# Patient Record
Sex: Male | Born: 1960 | Race: White | Hispanic: No | Marital: Married | Smoking: Current every day smoker
Health system: Southern US, Community
[De-identification: ages and names within clinical notes are randomized; demographics above are authoritative.]

## PROBLEM LIST (undated history)

## (undated) DIAGNOSIS — F1910 Other psychoactive substance abuse, uncomplicated: Secondary | ICD-10-CM

## (undated) DIAGNOSIS — M1990 Unspecified osteoarthritis, unspecified site: Secondary | ICD-10-CM

## (undated) DIAGNOSIS — E785 Hyperlipidemia, unspecified: Secondary | ICD-10-CM

## (undated) DIAGNOSIS — M549 Dorsalgia, unspecified: Secondary | ICD-10-CM

## (undated) DIAGNOSIS — M75102 Unspecified rotator cuff tear or rupture of left shoulder, not specified as traumatic: Secondary | ICD-10-CM

## (undated) DIAGNOSIS — F419 Anxiety disorder, unspecified: Secondary | ICD-10-CM

## (undated) DIAGNOSIS — C9110 Chronic lymphocytic leukemia of B-cell type not having achieved remission: Secondary | ICD-10-CM

## (undated) DIAGNOSIS — S46219A Strain of muscle, fascia and tendon of other parts of biceps, unspecified arm, initial encounter: Secondary | ICD-10-CM

## (undated) DIAGNOSIS — R350 Frequency of micturition: Secondary | ICD-10-CM

## (undated) DIAGNOSIS — K219 Gastro-esophageal reflux disease without esophagitis: Secondary | ICD-10-CM

## (undated) DIAGNOSIS — R002 Palpitations: Secondary | ICD-10-CM

## (undated) HISTORY — PX: SHOULDER ARTHROSCOPY: SHX128

## (undated) HISTORY — PX: HEMORRHOID SURGERY: SHX153

## (undated) HISTORY — PX: VASECTOMY: SHX75

## (undated) HISTORY — PX: TONSILLECTOMY: SUR1361

## (undated) HISTORY — PX: LUMBAR DISC SURGERY: SHX700

---

## 2002-11-23 DIAGNOSIS — C9110 Chronic lymphocytic leukemia of B-cell type not having achieved remission: Secondary | ICD-10-CM

## 2002-11-23 HISTORY — DX: Chronic lymphocytic leukemia of B-cell type not having achieved remission: C91.10

## 2004-12-16 ENCOUNTER — Ambulatory Visit: Payer: Self-pay | Admitting: Rheumatology

## 2005-06-03 ENCOUNTER — Ambulatory Visit: Payer: Self-pay | Admitting: Oncology

## 2005-11-25 ENCOUNTER — Ambulatory Visit: Payer: Self-pay

## 2006-02-11 ENCOUNTER — Ambulatory Visit: Payer: Self-pay | Admitting: Surgery

## 2006-02-11 ENCOUNTER — Emergency Department: Payer: Self-pay | Admitting: Surgery

## 2006-11-23 DIAGNOSIS — S46219A Strain of muscle, fascia and tendon of other parts of biceps, unspecified arm, initial encounter: Secondary | ICD-10-CM

## 2006-11-23 HISTORY — DX: Strain of muscle, fascia and tendon of other parts of biceps, unspecified arm, initial encounter: S46.219A

## 2006-12-09 ENCOUNTER — Ambulatory Visit: Payer: Self-pay

## 2006-12-29 ENCOUNTER — Ambulatory Visit: Payer: Self-pay | Admitting: Orthopaedic Surgery

## 2009-02-25 ENCOUNTER — Ambulatory Visit: Payer: Self-pay | Admitting: Unknown Physician Specialty

## 2009-04-10 ENCOUNTER — Ambulatory Visit: Payer: Self-pay | Admitting: Anesthesiology

## 2009-04-12 ENCOUNTER — Ambulatory Visit: Payer: Self-pay | Admitting: Anesthesiology

## 2009-05-02 ENCOUNTER — Ambulatory Visit: Payer: Self-pay | Admitting: Anesthesiology

## 2009-05-21 ENCOUNTER — Ambulatory Visit: Payer: Self-pay | Admitting: Internal Medicine

## 2009-06-12 ENCOUNTER — Ambulatory Visit: Payer: Self-pay | Admitting: Anesthesiology

## 2009-07-04 ENCOUNTER — Ambulatory Visit: Payer: Self-pay | Admitting: Gastroenterology

## 2009-07-23 ENCOUNTER — Ambulatory Visit: Payer: Self-pay | Admitting: Anesthesiology

## 2011-02-06 ENCOUNTER — Ambulatory Visit: Payer: Self-pay | Admitting: Orthopaedic Surgery

## 2012-02-18 ENCOUNTER — Other Ambulatory Visit: Payer: Self-pay | Admitting: Oncology

## 2013-01-27 ENCOUNTER — Emergency Department: Payer: Self-pay | Admitting: Emergency Medicine

## 2013-02-06 ENCOUNTER — Emergency Department: Payer: Self-pay | Admitting: Emergency Medicine

## 2013-02-13 ENCOUNTER — Emergency Department: Payer: Self-pay | Admitting: Unknown Physician Specialty

## 2013-12-20 ENCOUNTER — Ambulatory Visit: Payer: Self-pay | Admitting: Internal Medicine

## 2014-03-05 ENCOUNTER — Ambulatory Visit: Payer: Self-pay | Admitting: Ophthalmology

## 2014-09-05 ENCOUNTER — Ambulatory Visit: Payer: Self-pay | Admitting: Internal Medicine

## 2015-03-16 NOTE — Op Note (Signed)
PATIENT NAME:  Blake Lopez, Blake Lopez MR#:  [id number] DATE OF BIRTH:  1961/05/18  DATE OF PROCEDURE:  03/05/2014  PREOPERATIVE DIAGNOSIS: Cataract, left eye.  POSTOPERATIVE DIAGNOSIS:  Cataract, left eye.  PREOPERATIVE DIAGNOSIS:  Cataract, left eye.    POSTOPERATIVE DIAGNOSIS:  Cataract, left eye.  PROCEDURE PERFORMED:  Extracapsular cataract extraction using phacoemulsification with placement of an Alcon [id number], 21.0-diopter posterior chamber lens with 1.5 diopters of cylinder , serial # [id number].  SURGEON:  Loura Back. Maliek Schellhorn, MD  ASSISTANT:  None.  ANESTHESIA:  4% lidocaine and 0.75% Marcaine in a 50/50 mixture 10 units/mL of Hylenex added, given as a peribulbar.  ANESTHESIOLOGIST:  Dr. Benjamine Mola.  COMPLICATIONS:  None.  ESTIMATED BLOOD LOSS:  Less than 1 ml.  DESCRIPTION OF PROCEDURE:  The patient was brought to the operating room and each eye was anesthetized with topical Paracaine.  With the patient sitting upright and fixing at a distant target, the 3 o'clock  and 9 o'clock  positions were marked using ASICO toric marker.  The marks were reinforced with marking pin. The patient was placed in the supine position and given IV sedation. The patient then had a peribulbar block. He was then prepped and draped in the usual fashion. The vertical rectus muscles were imbricated using 5-0 silk sutures, bridle sutures. A toric marker was brought to the table, and the 16 degree mark was marked with a marking pen on the cornea and the 105 degree mark for location of the incision. Hemostasis was obtained with cautery with a pencil cautery.  Partial thickness scleral groove was made and dissected anteriorly and clear cornea with an Alcon crescent knife.  The anterior chamber was entered superotemporally through clear cornea with the paracentesis knife and through the lamellar dissection with a 2.6 mm keratome. DisCoVisc was used to replace the aqueous and a continuous tear of circular capsulorrhexis was  carried out.  Hydrodissection and hydrodelineation were used to loosen the central nucleus and the epinucleus.  Phacoemulsification was carried out in a divide-and-conquer technique with an ultrasound time of 37 seconds. Average power was 10.9%.  The CDE was 7/94.  Irrigation and aspiration was used to remove the dual cortex. The capsular bag was inflated with DisCoVisc and intraocular lens was inserted into the capsular bag under direct visualization. It was allowed to unfold and the marks and the haptics were lined up with 16 degree mark that had been made previously. Careful irrigation and aspiration was used to remove residual DisCoVisc. The position of the lens was rechecked and found to be lined up properly. The wound was inflated with balanced salt and Miostat was injected via the paracentesis tract. 0.1 mL of cefuroxime containing 1 mg of drug was injected via the paracentesis tract. The wound was checked for leaks; none were found. The bridle sutures were removed. Two drops of Vigamox were placed in the eye, and a shield was placed on the eye.  The patient was discharged to recovery room in good condition.      ____________________________ Loura Back Shavaun Osterloh, MD sad:dmm D: 03/05/2014 12:16:19 ET T: 03/05/2014 12:39:09 ET JOB#: [id number]  cc: Remo Lipps A. Deryck Hippler, MD, <Dictator> Martie Lee MD ELECTRONICALLY SIGNED 03/12/2014 13:06

## 2015-03-20 ENCOUNTER — Encounter: Payer: Self-pay | Admitting: *Deleted

## 2015-07-04 ENCOUNTER — Encounter
Admission: RE | Admit: 2015-07-04 | Discharge: 2015-07-04 | Disposition: A | Discharge status: Home / Self Care | Payer: BLUE CROSS/BLUE SHIELD | Source: Ambulatory Visit | Attending: Surgery | Admitting: Surgery

## 2015-07-04 DIAGNOSIS — Z01812 Encounter for preprocedural laboratory examination: Secondary | ICD-10-CM | POA: Diagnosis present

## 2015-07-04 DIAGNOSIS — Z01810 Encounter for preprocedural cardiovascular examination: Secondary | ICD-10-CM | POA: Insufficient documentation

## 2015-07-04 HISTORY — DX: Anxiety disorder, unspecified: F41.9

## 2015-07-04 HISTORY — DX: Other psychoactive substance abuse, uncomplicated: F19.10

## 2015-07-04 HISTORY — DX: Hyperlipidemia, unspecified: E78.5

## 2015-07-04 HISTORY — DX: Gastro-esophageal reflux disease without esophagitis: K21.9

## 2015-07-04 HISTORY — DX: Dorsalgia, unspecified: M54.9

## 2015-07-04 HISTORY — DX: Frequency of micturition: R35.0

## 2015-07-04 LAB — PROTIME-INR
INR: 1.02
Prothrombin Time: 13.6 seconds (ref 11.4–15.0)

## 2015-07-04 LAB — BASIC METABOLIC PANEL
Anion gap: 8 (ref 5–15)
BUN: 16 mg/dL (ref 6–20)
CO2: 26 mmol/L (ref 22–32)
Calcium: 9.6 mg/dL (ref 8.9–10.3)
Chloride: 107 mmol/L (ref 101–111)
Creatinine, Ser: 0.84 mg/dL (ref 0.61–1.24)
GFR calc Af Amer: >60 mL/min (ref >60)
GFR calc non Af Amer: >60 mL/min (ref >60)
Glucose, Bld: 97 mg/dL (ref 65–99)
Potassium: 3.8 mmol/L (ref 3.5–5.1)
Sodium: 141 mmol/L (ref 135–145)

## 2015-07-04 LAB — DIFFERENTIAL
BASOS PCT: 1 %
Basophils Absolute: 0.1 10*3/uL (ref 0–0.1)
Eosinophils Absolute: 0.2 10*3/uL (ref 0–0.7)
Eosinophils Relative: 2 %
LYMPHS ABS: 4.4 10*3/uL — AB (ref 1.0–3.6)
LYMPHS PCT: 39 %
MONOS PCT: 6 %
Monocytes Absolute: 0.7 10*3/uL (ref 0.2–1.0)
Neutro Abs: 5.9 10*3/uL (ref 1.4–6.5)
Neutrophils Relative %: 52 %

## 2015-07-04 LAB — CBC
HEMATOCRIT: 43.1 % (ref 40.0–52.0)
Hemoglobin: 14.8 g/dL (ref 13.0–18.0)
MCH: 31.5 pg (ref 26.0–34.0)
MCHC: 34.2 g/dL (ref 32.0–36.0)
MCV: 92.2 fL (ref 80.0–100.0)
PLATELETS: 166 10*3/uL (ref 150–440)
RBC: 4.68 MIL/uL (ref 4.40–5.90)
RDW: 13.8 % (ref 11.5–14.5)
WBC: 11.3 10*3/uL — AB (ref 3.8–10.6)

## 2015-07-04 NOTE — Patient Instructions (Signed)
  Your procedure is scheduled on: 07/12/15 Fri Report to Day Surgery. To find out your arrival time please call [phone] between 1PM - 3PM on Thurs 07/11/15.  Remember: Instructions that are not followed completely may result in serious medical risk, up to and including death, or upon the discretion of your surgeon and anesthesiologist your surgery may need to be rescheduled.    __x__ 1. Do not eat food or drink liquids after midnight. No gum chewing or hard candies.     ____ 2. No Alcohol for 24 hours before or after surgery.   ____ 3. Bring all medications with you on the day of surgery if instructed.    __x__ 4. Notify your doctor if there is any change in your medical condition     (cold, fever, infections).     Do not wear jewelry, make-up, hairpins, clips or nail polish.  Do not wear lotions, powders, or perfumes. You may wear deodorant.  Do not shave 48 hours prior to surgery. Men may shave face and neck.  Do not bring valuables to the hospital.    [facility] is not responsible for any belongings or valuables.               Contacts, dentures or bridgework may not be worn into surgery.  Leave your suitcase in the car. After surgery it may be brought to your room.  For patients admitted to the hospital, discharge time is determined by your                treatment team.   Patients discharged the day of surgery will not be allowed to drive home.   Please read over the following fact sheets that you were given:      __x__ Take these medicines the morning of surgery with A SIP OF WATER:    1. varenicline (CHANTIX) 1 MG tablet  2. HYDROcodone-acetaminophen (NORCO/VICODIN) 5-325 MG per tablet  3.   4.  5.  6.  ____ Fleet Enema (as directed)   ____ Use CHG Soap as directed  ____ Use inhalers on the day of surgery  ____ Stop metformin 2 days prior to surgery    ____ Take 1/2 of usual insulin dose the night before surgery and none on the morning of surgery.   ____  Stop Coumadin/Plavix/aspirin on   __x__ Stop Anti-inflammatories on stop Mobic 1 week before surgery   _x___ Stop supplements until after surgery.  Stop fish oil 1 week before  ____ Bring C-Pap to the hospital.

## 2015-07-12 ENCOUNTER — Encounter: Payer: Self-pay | Admitting: *Deleted

## 2015-07-12 ENCOUNTER — Ambulatory Visit: Payer: BLUE CROSS/BLUE SHIELD | Admitting: Anesthesiology

## 2015-07-12 ENCOUNTER — Encounter
Admission: RE | Disposition: A | Discharge status: Home / Self Care | Payer: Self-pay | Source: Ambulatory Visit | Attending: Surgery

## 2015-07-12 ENCOUNTER — Ambulatory Visit
Admission: RE | Admit: 2015-07-12 | Discharge: 2015-07-12 | Disposition: A | Discharge status: Home / Self Care | Payer: BLUE CROSS/BLUE SHIELD | Source: Ambulatory Visit | Attending: Surgery | Admitting: Surgery

## 2015-07-12 DIAGNOSIS — R739 Hyperglycemia, unspecified: Secondary | ICD-10-CM | POA: Insufficient documentation

## 2015-07-12 DIAGNOSIS — K219 Gastro-esophageal reflux disease without esophagitis: Secondary | ICD-10-CM | POA: Diagnosis not present

## 2015-07-12 DIAGNOSIS — F17210 Nicotine dependence, cigarettes, uncomplicated: Secondary | ICD-10-CM | POA: Insufficient documentation

## 2015-07-12 DIAGNOSIS — M069 Rheumatoid arthritis, unspecified: Secondary | ICD-10-CM | POA: Diagnosis not present

## 2015-07-12 DIAGNOSIS — F419 Anxiety disorder, unspecified: Secondary | ICD-10-CM | POA: Diagnosis not present

## 2015-07-12 DIAGNOSIS — K4090 Unilateral inguinal hernia, without obstruction or gangrene, not specified as recurrent: Secondary | ICD-10-CM | POA: Diagnosis not present

## 2015-07-12 DIAGNOSIS — M549 Dorsalgia, unspecified: Secondary | ICD-10-CM | POA: Diagnosis not present

## 2015-07-12 DIAGNOSIS — G8929 Other chronic pain: Secondary | ICD-10-CM | POA: Insufficient documentation

## 2015-07-12 HISTORY — PX: INGUINAL HERNIA REPAIR: SHX194

## 2015-07-12 SURGERY — REPAIR, HERNIA, INGUINAL, ADULT
Anesthesia: General | Laterality: Left | Wound class: Clean

## 2015-07-12 MED ORDER — FENTANYL CITRATE (PF) 100 MCG/2ML IJ SOLN
INTRAMUSCULAR | Status: DC | PRN
  Administered 2015-07-12: 25 ug via INTRAVENOUS
  Administered 2015-07-12: 50 ug via INTRAVENOUS

## 2015-07-12 MED ORDER — LACTATED RINGERS IV SOLN
INTRAVENOUS | Status: DC
  Administered 2015-07-12: 12:00:00 via INTRAVENOUS

## 2015-07-12 MED ORDER — HYDROCODONE-ACETAMINOPHEN 5-325 MG PO TABS: 1.0000 | ORAL_TABLET | ORAL | Status: DC | PRN

## 2015-07-12 MED ORDER — LIDOCAINE HCL (CARDIAC) 20 MG/ML IV SOLN
INTRAVENOUS | Status: DC | PRN
  Administered 2015-07-12: 100 mg via INTRAVENOUS

## 2015-07-12 MED ORDER — DEXAMETHASONE SODIUM PHOSPHATE 4 MG/ML IJ SOLN
INTRAMUSCULAR | Status: DC | PRN
  Administered 2015-07-12: 10 mg via INTRAVENOUS

## 2015-07-12 MED ORDER — BUPIVACAINE-EPINEPHRINE (PF) 0.5% -1:200000 IJ SOLN
INTRAMUSCULAR | Status: DC | PRN
  Administered 2015-07-12: 15 mL via PERINEURAL

## 2015-07-12 MED ORDER — HYDROMORPHONE HCL 1 MG/ML IJ SOLN: 0.2500 mg | INTRAMUSCULAR | Status: DC | PRN

## 2015-07-12 MED ORDER — EPHEDRINE SULFATE 50 MG/ML IJ SOLN
INTRAMUSCULAR | Status: DC | PRN
  Administered 2015-07-12 (×2): 5 mg via INTRAVENOUS

## 2015-07-12 MED ORDER — ONDANSETRON HCL 4 MG/2ML IJ SOLN
INTRAMUSCULAR | Status: DC | PRN
  Administered 2015-07-12: 4 mg via INTRAVENOUS

## 2015-07-12 MED ORDER — MIDAZOLAM HCL 2 MG/2ML IJ SOLN
INTRAMUSCULAR | Status: DC | PRN
  Administered 2015-07-12: 2 mg via INTRAVENOUS

## 2015-07-12 MED ORDER — BUPIVACAINE-EPINEPHRINE (PF) 0.5% -1:200000 IJ SOLN
INTRAMUSCULAR | Status: AC
  Filled 2015-07-12: qty 30

## 2015-07-12 MED ORDER — CEFAZOLIN SODIUM 1-5 GM-% IV SOLN
1.0000 g | Freq: Once | INTRAVENOUS | Status: AC
  Administered 2015-07-12: 1 g via INTRAVENOUS

## 2015-07-12 MED ORDER — CEFAZOLIN SODIUM 1-5 GM-% IV SOLN
INTRAVENOUS | Status: AC
  Administered 2015-07-12: 1 g via INTRAVENOUS
  Filled 2015-07-12: qty 50

## 2015-07-12 MED ORDER — ONDANSETRON HCL 4 MG/2ML IJ SOLN: 4.0000 mg | Freq: Once | INTRAMUSCULAR | Status: DC | PRN

## 2015-07-12 MED ORDER — GLYCOPYRROLATE 0.2 MG/ML IJ SOLN
INTRAMUSCULAR | Status: DC | PRN
  Administered 2015-07-12: 0.2 mg via INTRAVENOUS

## 2015-07-12 MED ORDER — FAMOTIDINE 20 MG PO TABS
ORAL_TABLET | ORAL | Status: AC
  Administered 2015-07-12: 20 mg via ORAL
  Filled 2015-07-12: qty 1

## 2015-07-12 MED ORDER — PROPOFOL 10 MG/ML IV BOLUS
INTRAVENOUS | Status: DC | PRN
  Administered 2015-07-12: 80 mg via INTRAVENOUS
  Administered 2015-07-12: 40 mg via INTRAVENOUS
  Administered 2015-07-12: 160 mg via INTRAVENOUS

## 2015-07-12 MED ORDER — FAMOTIDINE 20 MG PO TABS
20.0000 mg | ORAL_TABLET | Freq: Once | ORAL | Status: AC
  Administered 2015-07-12: 20 mg via ORAL

## 2015-07-12 SURGICAL SUPPLY — 24 items
BLADE SURG 15 STRL LF DISP TIS (BLADE) ×1 IMPLANT
BLADE SURG 15 STRL SS (BLADE) ×2
CANISTER SUCT 1200ML W/VALVE (MISCELLANEOUS) ×3 IMPLANT
CHLORAPREP W/TINT 26ML (MISCELLANEOUS) ×3 IMPLANT
DRAIN PENROSE 5/8X18 LTX STRL (WOUND CARE) ×3 IMPLANT
DRAPE PED LAPAROTOMY (DRAPES) ×3 IMPLANT
GLOVE BIO SURGEON STRL SZ7.5 (GLOVE) ×9 IMPLANT
GOWN STRL REUS W/ TWL LRG LVL3 (GOWN DISPOSABLE) ×3 IMPLANT
GOWN STRL REUS W/TWL LRG LVL3 (GOWN DISPOSABLE) ×6
KIT RM TURNOVER STRD PROC AR (KITS) ×3 IMPLANT
LABEL OR SOLS (LABEL) ×3 IMPLANT
LIQUID BAND (GAUZE/BANDAGES/DRESSINGS) ×3 IMPLANT
MESH SYNTHETIC 4X6 SOFT BARD (Mesh General) ×1 IMPLANT
MESH SYNTHETIC SOFT BARD 4X6 (Mesh General) ×2 IMPLANT
NDL SAFETY 25GX1.5 (NEEDLE) ×3 IMPLANT
NS IRRIG 500ML POUR BTL (IV SOLUTION) ×3 IMPLANT
PACK BASIN MINOR ARMC (MISCELLANEOUS) ×3 IMPLANT
PAD GROUND ADULT SPLIT (MISCELLANEOUS) ×3 IMPLANT
SUT CHROMIC 4 0 RB 1X27 (SUTURE) ×3 IMPLANT
SUT MNCRL AB 4-0 PS2 18 (SUTURE) ×3 IMPLANT
SUT SURGILON 0 30 BLK (SUTURE) ×9 IMPLANT
SUT VIC AB 4-0 SH 27 (SUTURE) ×2
SUT VIC AB 4-0 SH 27XANBCTRL (SUTURE) ×1 IMPLANT
SYRINGE 10CC LL (SYRINGE) ×3 IMPLANT

## 2015-07-12 NOTE — Anesthesia Preprocedure Evaluation (Signed)
Anesthesia Evaluation  Patient identified by MRN, date of birth, ID band Patient awake    Reviewed: Allergy & Precautions, NPO status , Patient's Chart, lab work & pertinent test results  Airway Mallampati: II  TM Distance: >3 FB Neck ROM: Limited    Dental  (+) Teeth Intact   Pulmonary former smoker,  breath sounds clear to auscultation  Pulmonary exam normal       Cardiovascular Exercise Tolerance: Good negative cardio ROS  Rhythm:Regular Rate:Normal     Neuro/Psych    GI/Hepatic GERD-  ,Very rare.   Endo/Other    Renal/GU      Musculoskeletal   Abdominal Normal abdominal exam  (+)  Abdomen: soft.    Peds  Hematology   Anesthesia Other Findings   Reproductive/Obstetrics                             Anesthesia Physical Anesthesia Plan  ASA: II  Anesthesia Plan: General   Post-op Pain Management:    Induction: Intravenous  Airway Management Planned: LMA  Additional Equipment:   Intra-op Plan:   Post-operative Plan: Extubation in OR  Informed Consent: I have reviewed the patients History and Physical, chart, labs and discussed the procedure including the risks, benefits and alternatives for the proposed anesthesia with the patient or authorized representative who has indicated his/her understanding and acceptance.     Plan Discussed with: CRNA  Anesthesia Plan Comments:         Anesthesia Quick Evaluation

## 2015-07-12 NOTE — H&P (Signed)
  He reports no change in condition since office H and P.   Discussed plan for left inguinal hernia repair

## 2015-07-12 NOTE — Anesthesia Postprocedure Evaluation (Signed)
  Anesthesia Post-op Note  Patient: Blake Lopez.  Procedure(s) Performed: Procedure(s): HERNIA REPAIR INGUINAL ADULT (Left)  Anesthesia type:General  Patient location: PACU  Post pain: Pain level controlled  Post assessment: Post-op Vital signs reviewed, Patient's Cardiovascular Status Stable, Respiratory Function Stable, Patent Airway and No signs of Nausea or vomiting  Post vital signs: Reviewed and stable  Last Vitals:  Filed Vitals:   07/12/15 1326  BP: 126/70  Pulse: 56  Temp: 36.1 C  Resp: 9    Level of consciousness: awake, alert  and patient cooperative  Complications: No apparent anesthesia complications

## 2015-07-12 NOTE — Discharge Instructions (Signed)
Take acetaminophen or acetaminophen with hydrocodone as needed for pain  May shower.  Avoid straining and heavy lifting.AMBULATORY SURGERY  DISCHARGE INSTRUCTIONS   1) The drugs that you were given will stay in your system until tomorrow so for the next 24 hours you should not:  A) Drive an automobile B) Make any legal decisions C) Drink any alcoholic beverage   2) You may resume regular meals tomorrow.  Today it is better to start with liquids and gradually work up to solid foods.  You may eat anything you prefer, but it is better to start with liquids, then soup and crackers, and gradually work up to solid foods.   3) Please notify your doctor immediately if you have any unusual bleeding, trouble breathing, redness and pain at the surgery site, drainage, fever, or pain not relieved by medication.   4) Additional Instructions:Splint abdominal when ever you cough, sneeze or move.  Ice pack to incisional area as needed.  Cough and deep breath every 2 hours while wake.

## 2015-07-12 NOTE — Progress Notes (Signed)
Pt awakened oral airway removed intact. 

## 2015-07-12 NOTE — Progress Notes (Signed)
Pt arrives from OR with oral airway intact. 

## 2015-07-12 NOTE — Op Note (Signed)
OPERATIVE REPORT  PREOPERATIVE DIAGNOSIS: left inguinal hernia  POSTOPERATIVE DIAGNOSIS:left  inguinal hernia  PROCEDURE:  left inguinal hernia repair  ANESTHESIA:  General  SURGEON:  Rochel Brome M.D.  INDICATIONS: He reported recent bulging in the left groin. A left inguinal hernia was demonstrated on physical exam. Repair was recommended for definitive treatment.  With the patient on the operating table in the supine position the left lower quadrant was prepared with clippers and with ChloraPrep and draped in a sterile manner. A transversely oriented suprapubic incision was made and carried down through subcutaneous tissues. Electrocautery was used for hemostasis. The Scarpa's fascia was incised. The external oblique aponeurosis was incised along the course of its fibers to open the external ring and expose the inguinal cord structures. The cord structures were mobilized. A Penrose drain was passed around the cord structures for traction. Cremaster fibers were spread to expose an indirect hernia sac which was dissected free from surrounding structures and the sac was some 5 cm in length. The sac was opened and its continuity with the peritoneal cavity was demonstrated. A high ligation of the sac was done with a 4-0 Vicryl suture ligature. The sac was excised. A cord lipoma was dissected free from surrounding structures and ligated with 4-0 Vicryl suture ligature and excised. No tissues were submitted for pathology. The floor of the inguinal canal was repaired with 0 Surgilon sutures suturing the conjoined tendon to the shelving edge of the inguinal ligament incorporating transversalis fascia into the repair. The last stitch led to satisfactory narrowing of the internal ring.  Bard soft mesh was cut to create an oval shape and was placed over the repair. This was sutured to the repair with interrupted 0 Surgilon sutures and also sutured medially to the deep fascia and on both sides of the internal  ring. Next after seeing hemostasis was intact the cord structures were replaced along the floor of the inguinal canal. The cut edges of the external oblique aponeurosis were closed with a running 4-0 Vicryl suture to re-create the external ring. The deep fascia superior and lateral to the repair site was infiltrated with half percent Sensorcaine with epinephrine. Subcutaneous tissues were also infiltrated. The Scarpa's fascia was closed with interrupted 4-0 Vicryl sutures. The skin was closed with running 4-0 Monocryl subcuticular suture and LiquiBand. The testicle remained in the scrotum  The patient appeared to be in satisfactory condition and was prepared for transfer to the recovery room.  Rochel Brome M.D.

## 2015-07-12 NOTE — Transfer of Care (Signed)
Immediate Anesthesia Transfer of Care Note  Patient: Blake Lopez.  Procedure(s) Performed: Procedure(s): HERNIA REPAIR INGUINAL ADULT (Left)  Patient Location: PACU  Anesthesia Type:General  Level of Consciousness: awake, alert , oriented and patient cooperative  Airway & Oxygen Therapy: Patient Spontanous Breathing and Patient connected to face mask oxygen  Post-op Assessment: Report given to RN, Post -op Vital signs reviewed and stable and Patient moving all extremities X 4  Post vital signs: Reviewed and stable  Last Vitals:  Filed Vitals:   07/12/15 1326  BP: 126/70  Pulse: 56  Temp: 36.1 C  Resp: 9    Complications: No apparent anesthesia complications

## 2015-07-12 NOTE — Anesthesia Procedure Notes (Signed)
Procedure Name: LMA Insertion Date/Time: 07/12/2015 12:11 PM Performed by: Silvana Newness Pre-anesthesia Checklist: Patient identified, Emergency Drugs available, Suction available, Patient being monitored and Timeout performed Patient Re-evaluated:Patient Re-evaluated prior to inductionOxygen Delivery Method: Circle system utilized Preoxygenation: Pre-oxygenation with 100% oxygen Intubation Type: IV induction Ventilation: Mask ventilation without difficulty LMA: LMA inserted LMA Size: 4.5 Number of attempts: 1 Placement Confirmation: positive ETCO2 and breath sounds checked- equal and bilateral Tube secured with: Tape Dental Injury: Teeth and Oropharynx as per pre-operative assessment

## 2015-11-24 DIAGNOSIS — R002 Palpitations: Secondary | ICD-10-CM

## 2015-11-24 HISTORY — PX: BACK SURGERY: SHX140

## 2015-11-24 HISTORY — DX: Palpitations: R00.2

## 2016-04-30 ENCOUNTER — Emergency Department
Admission: EM | Admit: 2016-04-30 | Discharge: 2016-04-30 | Disposition: A | Discharge status: Home / Self Care | Payer: BLUE CROSS/BLUE SHIELD | Attending: Emergency Medicine | Admitting: Emergency Medicine

## 2016-04-30 ENCOUNTER — Encounter: Payer: Self-pay | Admitting: *Deleted

## 2016-04-30 ENCOUNTER — Emergency Department: Payer: BLUE CROSS/BLUE SHIELD

## 2016-04-30 DIAGNOSIS — Z79899 Other long term (current) drug therapy: Secondary | ICD-10-CM | POA: Diagnosis not present

## 2016-04-30 DIAGNOSIS — F17200 Nicotine dependence, unspecified, uncomplicated: Secondary | ICD-10-CM | POA: Insufficient documentation

## 2016-04-30 DIAGNOSIS — I480 Paroxysmal atrial fibrillation: Secondary | ICD-10-CM

## 2016-04-30 DIAGNOSIS — R000 Tachycardia, unspecified: Secondary | ICD-10-CM | POA: Diagnosis present

## 2016-04-30 LAB — COMPREHENSIVE METABOLIC PANEL
ALBUMIN: 4.4 g/dL (ref 3.5–5.0)
ALK PHOS: 67 U/L (ref 38–126)
ALT: 29 U/L (ref 17–63)
ANION GAP: 5 (ref 5–15)
AST: 28 U/L (ref 15–41)
BILIRUBIN TOTAL: 0.4 mg/dL (ref 0.3–1.2)
BUN: 22 mg/dL — AB (ref 6–20)
CO2: 26 mmol/L (ref 22–32)
Calcium: 9.3 mg/dL (ref 8.9–10.3)
Chloride: 110 mmol/L (ref 101–111)
Creatinine, Ser: 0.84 mg/dL (ref 0.61–1.24)
GFR calc Af Amer: >60 mL/min (ref >60)
GFR calc non Af Amer: >60 mL/min (ref >60)
GLUCOSE: 128 mg/dL — AB (ref 65–99)
POTASSIUM: 3.4 mmol/L — AB (ref 3.5–5.1)
SODIUM: 141 mmol/L (ref 135–145)
TOTAL PROTEIN: 6.7 g/dL (ref 6.5–8.1)

## 2016-04-30 LAB — CBC WITH DIFFERENTIAL/PLATELET
BASOS ABS: 0.5 10*3/uL — AB (ref 0–0.1)
Basophils Relative: 4 %
EOS PCT: 3 %
Eosinophils Absolute: 0.4 10*3/uL (ref 0–0.7)
HEMATOCRIT: 44.7 % (ref 40.0–52.0)
Hemoglobin: 15.3 g/dL (ref 13.0–18.0)
LYMPHS PCT: 41 %
Lymphs Abs: 5.2 10*3/uL — ABNORMAL HIGH (ref 1.0–3.6)
MCH: 31.3 pg (ref 26.0–34.0)
MCHC: 34.1 g/dL (ref 32.0–36.0)
MCV: 91.9 fL (ref 80.0–100.0)
MONO ABS: 0.7 10*3/uL (ref 0.2–1.0)
MONOS PCT: 6 %
NEUTROS ABS: 5.9 10*3/uL (ref 1.4–6.5)
Neutrophils Relative %: 46 %
PLATELETS: 164 10*3/uL (ref 150–440)
RBC: 4.87 MIL/uL (ref 4.40–5.90)
RDW: 13.5 % (ref 11.5–14.5)
WBC: 12.8 10*3/uL — ABNORMAL HIGH (ref 3.8–10.6)

## 2016-04-30 LAB — TSH: TSH: 4.702 u[IU]/mL — ABNORMAL HIGH (ref 0.350–4.500)

## 2016-04-30 LAB — MAGNESIUM: Magnesium: 1.9 mg/dL (ref 1.7–2.4)

## 2016-04-30 MED ORDER — DILTIAZEM HCL ER COATED BEADS 120 MG PO CP24: 120.0000 mg | ORAL_CAPSULE | Freq: Every day | ORAL | Status: DC

## 2016-04-30 MED ORDER — DILTIAZEM HCL 25 MG/5ML IV SOLN
5.0000 mg | Freq: Once | INTRAVENOUS | Status: AC
  Administered 2016-04-30: 5 mg via INTRAVENOUS
  Filled 2016-04-30: qty 5

## 2016-04-30 NOTE — ED Notes (Signed)
Pt discharged to home.  Family member driving.  Discharge instructions reviewed.  Verbalized understanding.  No questions or concerns at this time.  Teach back verified.  Pt in NAD.  No items left in ED.   

## 2016-04-30 NOTE — Discharge Instructions (Signed)
Atrial Fibrillation °Atrial fibrillation is a type of irregular or rapid heartbeat (arrhythmia). In atrial fibrillation, the heart quivers continuously in a chaotic pattern. This occurs when parts of the heart receive disorganized signals that make the heart unable to pump blood normally. This can increase the risk for stroke, heart failure, and other heart-related conditions. There are different types of atrial fibrillation, including: °· Paroxysmal atrial fibrillation. This type starts suddenly, and it usually stops on its own shortly after it starts. °· Persistent atrial fibrillation. This type often lasts longer than a week. It may stop on its own or with treatment. °· Long-lasting persistent atrial fibrillation. This type lasts longer than 12 months. °· Permanent atrial fibrillation. This type does not go away. °Talk with your health care provider to learn about the type of atrial fibrillation that you have. °CAUSES °This condition is caused by some heart-related conditions or procedures, including: °· A heart attack. °· Coronary artery disease. °· Heart failure. °· Heart valve conditions. °· High blood pressure. °· Inflammation of the sac that surrounds the heart (pericarditis). °· Heart surgery. °· Certain heart rhythm disorders, such as Wolf-Parkinson-White syndrome. °Other causes include: °· Pneumonia. °· Obstructive sleep apnea. °· Blockage of an artery in the lungs (pulmonary embolism, or PE). °· Lung cancer. °· Chronic lung disease. °· Thyroid problems, especially if the thyroid is overactive (hyperthyroidism). °· Caffeine. °· Excessive alcohol use or illegal drug use. °· Use of some medicines, including certain decongestants and diet pills. °Sometimes, the cause cannot be found. °RISK FACTORS °This condition is more likely to develop in: °· People who are older in age. °· People who smoke. °· People who have diabetes mellitus. °· People who are overweight (obese). °· Athletes who exercise  vigorously. °SYMPTOMS °Symptoms of this condition include: °· A feeling that your heart is beating rapidly or irregularly. °· A feeling of discomfort or pain in your chest. °· Shortness of breath. °· Sudden light-headedness or weakness. °· Getting tired easily during exercise. °In some cases, there are no symptoms. °DIAGNOSIS °Your health care provider may be able to detect atrial fibrillation when taking your pulse. If detected, this condition may be diagnosed with: °· An electrocardiogram (ECG). °· A Holter monitor test that records your heartbeat patterns over a 24-hour period. °· Transthoracic echocardiogram (TTE) to evaluate how blood flows through your heart. °· Transesophageal echocardiogram (TEE) to view more detailed images of your heart. °· A stress test. °· Imaging tests, such as a CT scan or chest X-ray. °· Blood tests. °TREATMENT °The main goals of treatment are to prevent blood clots from forming and to keep your heart beating at a normal rate and rhythm. The type of treatment that you receive depends on many factors, such as your underlying medical conditions and how you feel when you are experiencing atrial fibrillation. °This condition may be treated with: °· Medicine to slow down the heart rate, bring the heart's rhythm back to normal, or prevent clots from forming. °· Electrical cardioversion. This is a procedure that resets your heart's rhythm by delivering a controlled, low-energy shock to the heart through your skin. °· Different types of ablation, such as catheter ablation, catheter ablation with pacemaker, or surgical ablation. These procedures destroy the heart tissues that send abnormal signals. When the pacemaker is used, it is placed under your skin to help your heart beat in a regular rhythm. °HOME CARE INSTRUCTIONS °· Take over-the counter and prescription medicines only as told by your health care provider. °·   If your health care provider prescribed a blood-thinning medicine  (anticoagulant), take it exactly as told. Taking too much blood-thinning medicine can cause bleeding. If you do not take enough blood-thinning medicine, you will not have the protection that you need against stroke and other problems.  Do not use tobacco products, including cigarettes, chewing tobacco, and e-cigarettes. If you need help quitting, ask your health care provider.  If you have obstructive sleep apnea, manage your condition as told by your health care provider.  Do not drink alcohol.  Do not drink beverages that contain caffeine, such as coffee, soda, and tea.  Maintain a healthy weight. Do not use diet pills unless your health care provider approves. Diet pills may make heart problems worse.  Follow diet instructions as told by your health care provider.  Exercise regularly as told by your health care provider.  Keep all follow-up visits as told by your health care provider. This is important. PREVENTION  Avoid drinking beverages that contain caffeine or alcohol.  Avoid certain medicines, especially medicines that are used for breathing problems.  Avoid certain herbs and herbal medicines, such as those that contain ephedra or ginseng.  Do not use illegal drugs, such as cocaine and amphetamines.  Do not smoke.  Manage your high blood pressure. SEEK MEDICAL CARE IF:  You notice a change in the rate, rhythm, or strength of your heartbeat.  You are taking an anticoagulant and you notice increased bruising.  You tire more easily when you exercise or exert yourself. SEEK IMMEDIATE MEDICAL CARE IF:  You have chest pain, abdominal pain, sweating, or weakness.  You feel nauseous.  You notice blood in your vomit, bowel movement, or urine.  You have shortness of breath.  You suddenly have swollen feet and ankles.  You feel dizzy.  You have sudden weakness or numbness of the face, arm, or leg, especially on one side of the body.  You have trouble speaking,  trouble understanding, or both (aphasia).  Your face or your eyelid droops on one side. These symptoms may represent a serious problem that is an emergency. Do not wait to see if the symptoms will go away. Get medical help right away. Call your local emergency services (911 in the [place] Do not drive yourself to the hospital.   This information is not intended to replace advice given to you by your health care provider. Make sure you discuss any questions you have with your health care provider.   Document Released: 11/09/2005 Document Revised: 07/31/2015 Document Reviewed: 03/06/2015 Elsevier Interactive Patient Education [employer].   Please return immediately if condition worsens. Please contact her primary physician or the physician you were given for referral. If you have any specialist physicians involved in her treatment and plan please also contact them. Thank you for using [facility].  Please follow-up with the cardiologist concerning new onset atrial fibrillation. You can start the Cardizem prescription later this morning. Please drink plenty of fluids and he may need further workup for possible hypothyroidism. Return to emergency department for chest pain, feelings of persistent lightheadedness or syncopal episode etc.

## 2016-04-30 NOTE — ED Notes (Addendum)
Pt ambulatory to triage.  Pt reports he has racing heart tonight that began 2 hours ago.  No chest pain or sob.  No n/v/d.   Pt also reports a headache.  Speech clear.  Pt alert.

## 2016-04-30 NOTE — ED Provider Notes (Signed)
Time Seen: Approximately0105 I have reviewed the triage notes  Chief Complaint: Tachycardia   History of Present Illness: Blake Lopez. is a 54 y.o. male who presents with feeling that his heart was racing 2 hours prior to arrival. He denies any chest pain or shortness of breath. Denies any arm or jaw pain. He denies any fever or productive cough or wheezing. Patient states he has similar episode 3 months ago to resolve on its own he did not seek any evaluation at that time. Denies any history of ischemic heart disease. Patient denies any lightheadedness. He states he feels slightly improved at this point. He describes some mild frontal headache without any fever, neck pain, photophobia, etc. Past Medical History  Diagnosis Date  . GERD (gastroesophageal reflux disease)   . Anxiety   . Urinary frequency   . Back pain     lower  . Substance abuse   . Elevated lipids     There are no active problems to display for this patient.   Past Surgical History  Procedure Laterality Date  . Hemorrhoid surgery    . Shoulder arthroscopy Bilateral     rt x 2   Lt x 1  . Inguinal hernia repair Left 07/12/2015    Procedure: HERNIA REPAIR INGUINAL ADULT;  Surgeon: Leonie Green, MD;  Location: [facility] ORS;  Service: General;  Laterality: Left;    Past Surgical History  Procedure Laterality Date  . Hemorrhoid surgery    . Shoulder arthroscopy Bilateral     rt x 2   Lt x 1  . Inguinal hernia repair Left 07/12/2015    Procedure: HERNIA REPAIR INGUINAL ADULT;  Surgeon: Leonie Green, MD;  Location: [facility] ORS;  Service: General;  Laterality: Left;    Current Outpatient Rx  Name  Route  Sig  Dispense  Refill  . acyclovir (ZOVIRAX) 400 MG tablet   Oral   Take 400 mg by mouth 3 (three) times daily.         Marland Kitchen diltiazem (CARDIZEM CD) 120 MG 24 hr capsule   Oral   Take 1 capsule (120 mg total) by mouth daily.   30 capsule   0   . escitalopram (LEXAPRO) 10 MG tablet   Oral    Take 10 mg by mouth daily.         Marland Kitchen HYDROcodone-acetaminophen (NORCO/VICODIN) 5-325 MG per tablet   Oral   Take 1 tablet by mouth every 6 (six) hours as needed for moderate pain.         Marland Kitchen lovastatin (ALTOPREV) 20 MG 24 hr tablet   Oral   Take 20 mg by mouth at bedtime.         . meloxicam (MOBIC) 15 MG tablet   Oral   Take 15 mg by mouth daily.         . Misc Natural Products (OSTEO BI-FLEX ADV TRIPLE ST PO)   Oral   Take 1 tablet by mouth 2 (two) times daily.         . Multiple Vitamin (MULTIVITAMIN) capsule   Oral   Take 1 capsule by mouth daily.         Marland Kitchen omega-3 acid ethyl esters (LOVAZA) 1 G capsule   Oral   Take 1 g by mouth 2 (two) times daily.         . varenicline (CHANTIX) 1 MG tablet   Oral   Take 1 mg by mouth 2 (two) times  daily.           Allergies:  Review of patient's allergies indicates no known allergies.  Family History: No family history on file.  Social History: Social History  Substance Use Topics  . Smoking status: Current Every Day Smoker -- 1.00 packs/day    Last Attempt to Quit: 06/29/2015  . Smokeless tobacco: Not on file  . Alcohol Use: No     Comment: stopped Jan 1st     Review of Systems:   10 point review of systems was performed and was otherwise negative:  Constitutional: No fever Eyes: No visual disturbances ENT: No sore throat, ear pain Cardiac: No chest pain Respiratory: No shortness of breath, wheezing, or stridor Abdomen: No abdominal pain, no vomiting, No diarrhea Endocrine: No weight loss, No night sweats Extremities: No peripheral edema, cyanosis Skin: No rashes, easy bruising Neurologic: No focal weakness, trouble with speech or swollowing Urologic: No dysuria, Hematuria, or urinary frequency Patient denies taking any stimulants or drinking alcohol this evening.  Physical Exam:  ED Triage Vitals  Enc Vitals Group     BP 04/30/16 0106 123/96 mmHg     Pulse Rate 04/30/16 0101 69     Resp  04/30/16 0101 20     Temp 04/30/16 0101 98.1 F (36.7 C)     Temp Source 04/30/16 0101 Oral     SpO2 04/30/16 0101 97 %     Weight 04/30/16 0101 171 lb (77.565 kg)     Height 04/30/16 0101 5\' 10"  (1.778 m)     Head Cir --      Peak Flow --      Pain Score 04/30/16 0102 2     Pain Loc --      Pain Edu? --      Excl. in [place]? --     General: Awake , Alert , and Oriented times 3; GCS 15 Head: Normal cephalic , atraumatic Eyes: Pupils equal , round, reactive to light Nose/Throat: No nasal drainage, patent upper airway without erythema or exudate.  Neck: Supple, Full range of motion, No anterior adenopathy or palpable thyroid masses Lungs: Clear to ascultation without wheezes , rhonchi, or rales Heart:Irregular rate, irregular rhythm without murmurs gallops or rubs Abdomen: Soft, non tender without rebound, guarding , or rigidity; bowel sounds positive and symmetric in all 4 quadrants. No organomegaly .        Extremities: 2 plus symmetric pulses. No edema, clubbing or cyanosis Neurologic: normal ambulation, Motor symmetric without deficits, sensory intact Skin: warm, dry, no rashes   Labs:   All laboratory work was reviewed including any pertinent negatives or positives listed below:  Labs Reviewed  COMPREHENSIVE METABOLIC PANEL - Abnormal; Notable for the following:    Potassium 3.4 (*)    Glucose, Bld 128 (*)    BUN 22 (*)    All other components within normal limits  CBC WITH DIFFERENTIAL/PLATELET - Abnormal; Notable for the following:    WBC 12.8 (*)    Lymphs Abs 5.2 (*)    Basophils Absolute 0.5 (*)    All other components within normal limits  TSH - Abnormal; Notable for the following:    TSH 4.702 (*)    All other components within normal limits  MAGNESIUM    EKG:  ED ECG REPORT I, Daymon Larsen, the attending physician, personally viewed and interpreted this ECG.  Date: 04/30/2016 EKG Time: 0 100 Rate: 96 Rhythm: Atrial fibrillation QRS Axis:  normal Intervals: Incomplete right  bundle branch block ST/T Wave abnormalities: Nonspecific T wave abnormality Prolonged QT interval Conduction Disturbances: none Narrative Interpretation: unremarkable No acute ischemic changes   Radiology: * DG Chest 2 View (Final result) Result time: 04/30/16 02:02:21   Final result by Rad Results In Interface (04/30/16 02:02:21)   Narrative:   CLINICAL DATA: Atrial fibrillation  EXAM: CHEST 2 VIEW  COMPARISON: None.  FINDINGS: Normal heart size and mediastinal contours. Mild interstitial coarsening, likely smoking-related. There is no edema, consolidation, effusion, or pneumothorax.  IMPRESSION: No acute finding.   Electronically Signed By: Monte Fantasia M.D. On: 04/30/2016 02:02           I personally reviewed the radiologic studies    Critical Care: * CRITICAL CARE Performed by: Daymon Larsen   Total critical care time: 35 minutes  Critical care time was exclusive of separately billable procedures and treating other patients.  Critical care was necessary to treat or prevent imminent or life-threatening deterioration.  Critical care was time spent personally by me on the following activities: development of treatment plan with patient and/or surrogate as well as nursing, discussions with consultants, evaluation of patient's response to treatment, examination of patient, obtaining history from patient or surrogate, ordering and performing treatments and interventions, ordering and review of laboratory studies, ordering and review of radiographic studies, pulse oximetry and re-evaluation of patient's condition. IV antiarrhythmic medication    ED Course:  Patient's stay here was uneventful and he had correction of his atrial fibrillation. I used a very low dose of IV Cardizem since the patient is not in a rapid ventricular rate. Patient was monitored for approximately 2 hours and remained in a sinus  bradycardia while sleeping He does not appear to have any signs of ischemic changes. His thyroid function may be slightly low and I advised them for further outpatient testing. He was referred to cardiology unassigned. I will discharge him on a low-dose of Cardizem CD. Patient was encouraged for outpatient follow-up for likely echocardiogram.    Assessment:  Paroxysmal atrial fibrillation with a controlled ventricular rate   Final Clinical Impression: *  Final diagnoses:  Paroxysmal atrial fibrillation ([place])     Plan: * Follow up with cardiology Patient was advised to return immediately if condition worsens. Patient was advised to follow up with their primary care physician or other specialized physicians involved in their outpatient care. The patient and/or family member/power of attorney had laboratory results reviewed at the bedside. All questions and concerns were addressed and appropriate discharge instructions were distributed by the nursing staff.             Daymon Larsen, MD 04/30/16 [phone]

## 2016-06-03 ENCOUNTER — Other Ambulatory Visit: Payer: Self-pay | Admitting: Internal Medicine

## 2016-06-03 DIAGNOSIS — M5116 Intervertebral disc disorders with radiculopathy, lumbar region: Secondary | ICD-10-CM

## 2016-06-04 ENCOUNTER — Ambulatory Visit: Payer: BLUE CROSS/BLUE SHIELD

## 2016-06-17 ENCOUNTER — Ambulatory Visit
Admission: RE | Admit: 2016-06-17 | Discharge: 2016-06-17 | Disposition: A | Discharge status: Home / Self Care | Payer: BLUE CROSS/BLUE SHIELD | Source: Ambulatory Visit | Attending: Internal Medicine | Admitting: Internal Medicine

## 2016-06-17 DIAGNOSIS — M4806 Spinal stenosis, lumbar region: Secondary | ICD-10-CM | POA: Insufficient documentation

## 2016-06-17 DIAGNOSIS — M5116 Intervertebral disc disorders with radiculopathy, lumbar region: Secondary | ICD-10-CM

## 2016-06-17 DIAGNOSIS — M47896 Other spondylosis, lumbar region: Secondary | ICD-10-CM | POA: Diagnosis not present

## 2017-08-09 ENCOUNTER — Ambulatory Visit
Admission: RE | Admit: 2017-08-09 | Discharge: 2017-08-09 | Disposition: A | Discharge status: Home / Self Care | Payer: BLUE CROSS/BLUE SHIELD | Source: Ambulatory Visit | Attending: Physician Assistant | Admitting: Physician Assistant

## 2017-08-09 ENCOUNTER — Other Ambulatory Visit: Payer: Self-pay | Admitting: Physician Assistant

## 2017-08-09 DIAGNOSIS — R51 Headache: Principal | ICD-10-CM

## 2017-08-09 DIAGNOSIS — R519 Headache, unspecified: Secondary | ICD-10-CM

## 2017-10-20 ENCOUNTER — Other Ambulatory Visit: Payer: Self-pay | Admitting: Podiatry

## 2017-10-21 ENCOUNTER — Other Ambulatory Visit: Payer: Self-pay

## 2017-10-21 ENCOUNTER — Encounter
Admission: RE | Admit: 2017-10-21 | Discharge: 2017-10-21 | Disposition: A | Discharge status: Home / Self Care | Payer: BLUE CROSS/BLUE SHIELD | Source: Ambulatory Visit | Attending: Podiatry | Admitting: Podiatry

## 2017-10-21 DIAGNOSIS — Z01810 Encounter for preprocedural cardiovascular examination: Secondary | ICD-10-CM | POA: Diagnosis present

## 2017-10-21 DIAGNOSIS — R001 Bradycardia, unspecified: Secondary | ICD-10-CM | POA: Diagnosis not present

## 2017-10-21 DIAGNOSIS — Z01812 Encounter for preprocedural laboratory examination: Secondary | ICD-10-CM | POA: Diagnosis present

## 2017-10-21 HISTORY — DX: Unspecified rotator cuff tear or rupture of left shoulder, not specified as traumatic: M75.102

## 2017-10-21 HISTORY — DX: Unspecified osteoarthritis, unspecified site: M19.90

## 2017-10-21 HISTORY — DX: Palpitations: R00.2

## 2017-10-21 HISTORY — DX: Chronic lymphocytic leukemia of B-cell type not having achieved remission: C91.10

## 2017-10-21 HISTORY — DX: Strain of muscle, fascia and tendon of other parts of biceps, unspecified arm, initial encounter: S46.219A

## 2017-10-21 LAB — APTT: APTT: 28 s (ref 24–36)

## 2017-10-21 LAB — PROTIME-INR
INR: 0.98
Prothrombin Time: 12.9 seconds (ref 11.4–15.2)

## 2017-10-21 NOTE — Patient Instructions (Addendum)
Your procedure is scheduled on: Tuesday, October 26, 2017 Report to Same Day Surgery on the 2nd floor in the [facility]. To find out your arrival time, please call [phone] between 1PM - 3PM on: Monday, October 25, 2017  REMEMBER: Instructions that are not followed completely may result in serious medical risk, up to and including death; or upon the discretion of your surgeon and anesthesiologist your surgery may need to be rescheduled.  Do not eat food after midnight the night before your procedure.  No gum chewing or hard candies.  You may however, drink CLEAR liquids up to 2 hours before you are scheduled to arrive at the hospital for your procedure.  Do not drink clear liquids within 2 hours of the start of your surgery.  Clear liquids include: - water  - apple juice without pulp - clear gatorade - black coffee or tea (Do NOT add anything to the coffee or tea) Do NOT drink anything that is not on this list.  No Alcohol for 24 hours before or after surgery.  No Smoking including e-cigarettes for 24 hours prior to surgery. No chewable tobacco products for at least 6 hours prior to surgery. No nicotine patches on the day of surgery.  Notify your doctor if there is any change in your medical condition (cold, fever, infection).  Do not wear jewelry, make-up, hairpins, clips or nail polish.  Do not wear lotions, powders, or perfumes. You may wear deodorant.  Do not shave 48 hours prior to surgery. Men may shave face and neck.  Contacts and dentures may not be worn into surgery.  Do not bring valuables to the hospital. [facility] is not responsible for any belongings or valuables.   TAKE THESE MEDICATIONS THE MORNING OF SURGERY WITH A SIP OF WATER:  1.  METOPROLOL 2.  Hydrocodone if needed for pain  Use CHG Soap or wipes as directed on instruction sheet.  NOW!  Stop ASPIRIN AND Anti-inflammatories such as MELOXICAM, ETODOLAC, Advil, Aleve, Ibuprofen, Motrin, Naproxen,  Naprosyn, Goodie powder, or aspirin products. (May take Tylenol or Acetaminophen if needed.)  NOW!  Stop OSTEO BI-FLEX AND ANY OTHER OVER THE COUNTER supplements until after surgery.   If you are being discharged the day of surgery, you will not be allowed to drive home. You will need someone to drive you home and stay with you that night.   If you are taking public transportation, you will need to have a responsible adult to with you.  Please call the number above if you have any questions about these instructions.

## 2017-10-25 MED ORDER — CEFAZOLIN SODIUM-DEXTROSE 2-4 GM/100ML-% IV SOLN
2.0000 g | INTRAVENOUS | Status: AC
  Administered 2017-10-26: 2 g via INTRAVENOUS

## 2017-10-26 ENCOUNTER — Ambulatory Visit: Payer: BLUE CROSS/BLUE SHIELD | Admitting: Certified Registered Nurse Anesthetist

## 2017-10-26 ENCOUNTER — Encounter
Admission: RE | Disposition: A | Discharge status: Home / Self Care | Payer: Self-pay | Source: Ambulatory Visit | Attending: Podiatry

## 2017-10-26 ENCOUNTER — Other Ambulatory Visit: Payer: Self-pay

## 2017-10-26 ENCOUNTER — Ambulatory Visit
Admission: RE | Admit: 2017-10-26 | Discharge: 2017-10-26 | Disposition: A | Discharge status: Home / Self Care | Payer: BLUE CROSS/BLUE SHIELD | Source: Ambulatory Visit | Attending: Podiatry | Admitting: Podiatry

## 2017-10-26 ENCOUNTER — Encounter: Payer: Self-pay | Admitting: *Deleted

## 2017-10-26 DIAGNOSIS — F419 Anxiety disorder, unspecified: Secondary | ICD-10-CM | POA: Diagnosis not present

## 2017-10-26 DIAGNOSIS — Z79899 Other long term (current) drug therapy: Secondary | ICD-10-CM | POA: Insufficient documentation

## 2017-10-26 DIAGNOSIS — Z87891 Personal history of nicotine dependence: Secondary | ICD-10-CM | POA: Diagnosis not present

## 2017-10-26 DIAGNOSIS — K219 Gastro-esophageal reflux disease without esophagitis: Secondary | ICD-10-CM | POA: Insufficient documentation

## 2017-10-26 DIAGNOSIS — M205X1 Other deformities of toe(s) (acquired), right foot: Secondary | ICD-10-CM | POA: Insufficient documentation

## 2017-10-26 HISTORY — PX: HALLUX VALGUS CHEILECTOMY: SHX6625

## 2017-10-26 LAB — URINE DRUG SCREEN, QUALITATIVE (ARMC ONLY)
AMPHETAMINES, UR SCREEN: NOT DETECTED
Barbiturates, Ur Screen: NOT DETECTED
Benzodiazepine, Ur Scrn: NOT DETECTED
COCAINE METABOLITE, UR ~~LOC~~: NOT DETECTED
Cannabinoid 50 Ng, Ur ~~LOC~~: NOT DETECTED
MDMA (ECSTASY) UR SCREEN: NOT DETECTED
METHADONE SCREEN, URINE: NOT DETECTED
Opiate, Ur Screen: NOT DETECTED
Phencyclidine (PCP) Ur S: NOT DETECTED
TRICYCLIC, UR SCREEN: NOT DETECTED

## 2017-10-26 SURGERY — CHEILECTOMY, GREAT TOE, WITH IMPLANT INSERTION
Anesthesia: General | Site: Foot | Laterality: Right | Wound class: Clean

## 2017-10-26 MED ORDER — PROPOFOL 10 MG/ML IV BOLUS
INTRAVENOUS | Status: AC
  Filled 2017-10-26: qty 20

## 2017-10-26 MED ORDER — HYDROCODONE-ACETAMINOPHEN 5-325 MG PO TABS: 1.0000 | ORAL_TABLET | ORAL | 0 refills | Status: AC | PRN

## 2017-10-26 MED ORDER — FENTANYL CITRATE (PF) 100 MCG/2ML IJ SOLN
INTRAMUSCULAR | Status: AC
  Filled 2017-10-26: qty 2

## 2017-10-26 MED ORDER — BUPIVACAINE HCL (PF) 0.5 % IJ SOLN
INTRAMUSCULAR | Status: AC
  Filled 2017-10-26: qty 30

## 2017-10-26 MED ORDER — PROPOFOL 10 MG/ML IV BOLUS
INTRAVENOUS | Status: DC | PRN
  Administered 2017-10-26: 180 mg via INTRAVENOUS

## 2017-10-26 MED ORDER — ONDANSETRON HCL 4 MG/2ML IJ SOLN
INTRAMUSCULAR | Status: AC
  Filled 2017-10-26: qty 2

## 2017-10-26 MED ORDER — NEOMYCIN-POLYMYXIN B GU 40-200000 IR SOLN
Status: AC
  Filled 2017-10-26: qty 2

## 2017-10-26 MED ORDER — MIDAZOLAM HCL 5 MG/5ML IJ SOLN
INTRAMUSCULAR | Status: DC | PRN
  Administered 2017-10-26: 2 mg via INTRAVENOUS

## 2017-10-26 MED ORDER — BUPIVACAINE HCL 0.5 % IJ SOLN
INTRAMUSCULAR | Status: DC | PRN
  Administered 2017-10-26: 10 mL

## 2017-10-26 MED ORDER — MIDAZOLAM HCL 2 MG/2ML IJ SOLN
INTRAMUSCULAR | Status: AC
  Filled 2017-10-26: qty 2

## 2017-10-26 MED ORDER — DEXAMETHASONE SODIUM PHOSPHATE 10 MG/ML IJ SOLN
INTRAMUSCULAR | Status: AC
  Filled 2017-10-26: qty 1

## 2017-10-26 MED ORDER — LACTATED RINGERS IV SOLN
INTRAVENOUS | Status: DC
  Administered 2017-10-26: 13:00:00 via INTRAVENOUS

## 2017-10-26 MED ORDER — EPHEDRINE SULFATE 50 MG/ML IJ SOLN
INTRAMUSCULAR | Status: AC
  Filled 2017-10-26: qty 1

## 2017-10-26 MED ORDER — POVIDONE-IODINE 7.5 % EX SOLN
Freq: Once | CUTANEOUS | Status: DC
  Filled 2017-10-26: qty 118

## 2017-10-26 MED ORDER — ONDANSETRON HCL 4 MG/2ML IJ SOLN: 4.0000 mg | Freq: Once | INTRAMUSCULAR | Status: DC | PRN

## 2017-10-26 MED ORDER — FAMOTIDINE 20 MG PO TABS
ORAL_TABLET | ORAL | Status: AC
  Administered 2017-10-26: 20 mg via ORAL
  Filled 2017-10-26: qty 1

## 2017-10-26 MED ORDER — LIDOCAINE HCL (CARDIAC) 20 MG/ML IV SOLN
INTRAVENOUS | Status: DC | PRN
Start: 2017-10-26 — End: 2017-10-26
  Administered 2017-10-26: 60 mg via INTRAVENOUS

## 2017-10-26 MED ORDER — FAMOTIDINE 20 MG PO TABS
20.0000 mg | ORAL_TABLET | Freq: Once | ORAL | Status: AC
  Administered 2017-10-26: 20 mg via ORAL

## 2017-10-26 MED ORDER — CEFAZOLIN SODIUM-DEXTROSE 2-4 GM/100ML-% IV SOLN
INTRAVENOUS | Status: AC
  Filled 2017-10-26: qty 100

## 2017-10-26 MED ORDER — DEXAMETHASONE SODIUM PHOSPHATE 10 MG/ML IJ SOLN
INTRAMUSCULAR | Status: DC | PRN
  Administered 2017-10-26: 5 mg via INTRAVENOUS

## 2017-10-26 MED ORDER — FENTANYL CITRATE (PF) 100 MCG/2ML IJ SOLN: 25.0000 ug | INTRAMUSCULAR | Status: DC | PRN

## 2017-10-26 MED ORDER — GLYCOPYRROLATE 0.2 MG/ML IJ SOLN
INTRAMUSCULAR | Status: DC | PRN
Start: 2017-10-26 — End: 2017-10-26
  Administered 2017-10-26: 0.2 mg via INTRAVENOUS

## 2017-10-26 MED ORDER — ONDANSETRON HCL 4 MG/2ML IJ SOLN
INTRAMUSCULAR | Status: DC | PRN
  Administered 2017-10-26: 4 mg via INTRAVENOUS

## 2017-10-26 MED ORDER — LIDOCAINE HCL (PF) 1 % IJ SOLN
INTRAMUSCULAR | Status: AC
  Filled 2017-10-26: qty 30

## 2017-10-26 MED ORDER — FENTANYL CITRATE (PF) 100 MCG/2ML IJ SOLN
INTRAMUSCULAR | Status: DC | PRN
  Administered 2017-10-26 (×2): 50 ug via INTRAVENOUS

## 2017-10-26 SURGICAL SUPPLY — 45 items
BAG COUNTER SPONGE EZ (MISCELLANEOUS) ×2 IMPLANT
BANDAGE ELASTIC 4 LF NS (GAUZE/BANDAGES/DRESSINGS) ×3 IMPLANT
BANDAGE STRETCH 3X4.1 STRL (GAUZE/BANDAGES/DRESSINGS) ×3 IMPLANT
BLADE MED AGGRESSIVE (BLADE) ×3 IMPLANT
BLADE SURG 15 STRL LF DISP TIS (BLADE) ×2 IMPLANT
BLADE SURG 15 STRL SS (BLADE) ×4
BLADE SURG MINI STRL (BLADE) ×3 IMPLANT
BNDG ESMARK 4X12 TAN STRL LF (GAUZE/BANDAGES/DRESSINGS) ×3 IMPLANT
BNDG GAUZE 4.5X4.1 6PLY STRL (MISCELLANEOUS) ×3 IMPLANT
CLOSURE WOUND 1/4X4 (GAUZE/BANDAGES/DRESSINGS) ×1
COUNTER SPONGE BAG EZ (MISCELLANEOUS) ×1
CUFF TOURN 18 STER (MISCELLANEOUS) IMPLANT
CUFF TOURN DUAL PL 12 NO SLV (MISCELLANEOUS) ×3 IMPLANT
DRAPE FLUOR MINI C-ARM 54X84 (DRAPES) ×3 IMPLANT
DURAPREP 26ML APPLICATOR (WOUND CARE) ×3 IMPLANT
ELECT REM PT RETURN 9FT ADLT (ELECTROSURGICAL) ×3
ELECTRODE REM PT RTRN 9FT ADLT (ELECTROSURGICAL) ×1 IMPLANT
GAUZE PETRO XEROFOAM 1X8 (MISCELLANEOUS) ×3 IMPLANT
GAUZE SPONGE 4X4 12PLY STRL (GAUZE/BANDAGES/DRESSINGS) ×3 IMPLANT
GAUZE STRETCH 2X75IN STRL (MISCELLANEOUS) ×3 IMPLANT
GLOVE BIO SURGEON STRL SZ7.5 (GLOVE) ×3 IMPLANT
GLOVE INDICATOR 8.0 STRL GRN (GLOVE) ×3 IMPLANT
GOWN STRL REUS W/ TWL LRG LVL3 (GOWN DISPOSABLE) ×2 IMPLANT
GOWN STRL REUS W/TWL LRG LVL3 (GOWN DISPOSABLE) ×4
LABEL OR SOLS (LABEL) ×3 IMPLANT
NEEDLE FILTER BLUNT 18X 1/2SAF (NEEDLE) ×2
NEEDLE FILTER BLUNT 18X1 1/2 (NEEDLE) ×1 IMPLANT
NEEDLE HYPO 25X1 1.5 SAFETY (NEEDLE) ×6 IMPLANT
NS IRRIG 500ML POUR BTL (IV SOLUTION) ×3 IMPLANT
PACK EXTREMITY ARMC (MISCELLANEOUS) ×3 IMPLANT
PAD CAST CTTN 4X4 STRL (SOFTGOODS) ×1 IMPLANT
PADDING CAST COTTON 4X4 STRL (SOFTGOODS) ×2
PENCIL ELECTRO HAND CTR (MISCELLANEOUS) ×3 IMPLANT
RASP SM TEAR CROSS CUT (RASP) ×3 IMPLANT
SOL PREP PVP 2OZ (MISCELLANEOUS) ×3
SOLUTION PREP PVP 2OZ (MISCELLANEOUS) ×1 IMPLANT
SPLINT FAST PLASTER 5X30 (CAST SUPPLIES) ×2
SPLINT PLASTER CAST FAST 5X30 (CAST SUPPLIES) ×1 IMPLANT
STOCKINETTE STRL 6IN 960660 (GAUZE/BANDAGES/DRESSINGS) ×3 IMPLANT
STRAP SAFETY BODY (MISCELLANEOUS) ×3 IMPLANT
STRIP CLOSURE SKIN 1/4X4 (GAUZE/BANDAGES/DRESSINGS) ×2 IMPLANT
SUT ETHILON 5-0 FS-2 18 BLK (SUTURE) ×3 IMPLANT
SUT VIC AB 4-0 FS2 27 (SUTURE) ×3 IMPLANT
SYR 10ML LL (SYRINGE) ×6 IMPLANT
WIRE Z .045 C-WIRE SPADE TIP (WIRE) ×3 IMPLANT

## 2017-10-26 NOTE — H&P (Signed)
  Medical history and physical in the chart was reviewed.  Patient stable for surgery. 

## 2017-10-26 NOTE — Op Note (Signed)
Date of operation: 10/26/2017.  Surgeon: [name] DPM.  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  Postoperative diagnosis: Same.  Procedure: Cheilectomy right great toe joint.  Anesthesia: LMA with local.  Estimated blood loss: Less than 5 cc.  Hemostasis: Pneumatic tourniquet right ankle 250 mmHg.  Pathology: None.  Complications: None apparent.  Operative indications: This is a 56 year old male with a complaint of a chronic painful right great toe joint. Conservative care has proved unfruitful and he elects for surgical correction.  Operative procedure: The patient was taken to the operating room and placed on the table in the supine position.Following satisfactory   LMA anesthesia the right foot was injected with 10 cc of 0.5% bupivacaine plain. A pneumatic tourniquet was applied at the level of the right ankle and the foot was prepped and draped in the usual sterile fashion. The foot was exsanguinated and the tourniquet inflated mmHg.      Attention was directed to the dorsal aspect of the right foot where an approximate 4 cm linear incision was made coursing proximal to distal. The incision was deepened via sharp and blunt dissection down to the level of the capsule of the joint where a linear capsulotomy was performed. Capsular and periosteal tissue reflected off of the head of the first metatarsal and base of the proximal phalanx. Significant hypertrophied bone was noted dorsally. Using a pneumatic saw the medial eminence as well as dorsal prominence was resected using a sagittal saw. Hypertrophied bone at the base of the proximal phalanx and around the lateral aspect of the first metatarsal head and phalanx was resected using a ronguer's. There was noted to be some loss of articular cartilage along the dorsal lateral aspect of the remaining first metatarsal head. Multiple drill holes were performed through the bone using a 0.045 inch K wire. The  raw bony edges were rasped smooth and the wound was flushed with copious amounts of sterile saline. Closure was then obtained using a 4-0 Vicryl running suture for all layers from periosteal and capsular closure through deep and superficial subcutaneous and skin closure. Tincture of benzoin and Steri-Strips applied followed by Xeroform and a sterile gauze bandage. The tourniquet was released and blood flow noted to return immediately to all digits on the right foot. Kerlix and an Ace wrap were then applied. Patient tolerated the procedure and anesthesia well and was transported to the PACU with vital signs stable and in good condition.

## 2017-10-26 NOTE — Anesthesia Procedure Notes (Signed)
Procedure Name: LMA Insertion Date/Time: 10/26/2017 1:58 PM Performed by: Dionne Bucy, CRNA Pre-anesthesia Checklist: Patient identified, Patient being monitored, Timeout performed, Emergency Drugs available and Suction available Patient Re-evaluated:Patient Re-evaluated prior to induction Oxygen Delivery Method: Circle system utilized Preoxygenation: Pre-oxygenation with 100% oxygen Induction Type: IV induction Ventilation: Mask ventilation without difficulty LMA: LMA inserted LMA Size: 4.5 Tube type: Oral Number of attempts: 1 Placement Confirmation: positive ETCO2 and breath sounds checked- equal and bilateral Tube secured with: Tape Dental Injury: Teeth and Oropharynx as per pre-operative assessment

## 2017-10-26 NOTE — Interval H&P Note (Signed)
History and Physical Interval Note:  10/26/2017 1:30 PM  Blake Lopez.  has presented today for surgery, with the diagnosis of Hallux Limitus  The various methods of treatment have been discussed with the patient and family. After consideration of risks, benefits and other options for treatment, the patient has consented to  Procedure(s): HALLUX VALGUS CHEILECTOMY-GREAT TOE (Right) as a surgical intervention .  The patient's history has been reviewed, patient examined, no change in status, stable for surgery.  I have reviewed the patient's chart and labs.  Questions were answered to the patient's satisfaction.     [name]

## 2017-10-26 NOTE — Transfer of Care (Signed)
Immediate Anesthesia Transfer of Care Note  Patient: Blake Lopez.  Procedure(s) Performed: HALLUX VALGUS CHEILECTOMY-GREAT TOE (Right Foot)  Patient Location: PACU  Anesthesia Type:General  Level of Consciousness: sedated  Airway & Oxygen Therapy: Patient Spontanous Breathing and Patient connected to face mask oxygen  Post-op Assessment: Report given to RN and Post -op Vital signs reviewed and stable  Post vital signs: Reviewed and stable  Last Vitals:  Vitals:   10/26/17 1219 10/26/17 1521  BP: 128/74 108/65  Pulse: (!) 50 (!) 50  Resp: 18 15  Temp: 36.6 C (P) 36.6 C  SpO2: 100% 100%    Last Pain:  Vitals:   10/26/17 1219  TempSrc: Oral  PainSc: 2          Complications: No apparent anesthesia complications

## 2017-10-26 NOTE — Discharge Instructions (Signed)
1. Elevate right lower extremity.  2. Keep the bandage on the right foot clean, dry, and do not remove.  3. Sponge bathe only right lower extremity.  4. Wear surgical shoe on the right foot whenever walking or standing.  5. Take one pain pill, hydrocodone, every 4 hours if needed for pain.

## 2017-10-26 NOTE — Anesthesia Post-op Follow-up Note (Signed)
Anesthesia QCDR form completed.        

## 2017-10-26 NOTE — Anesthesia Preprocedure Evaluation (Signed)
Anesthesia Evaluation  Patient identified by MRN, date of birth, ID band Patient awake    Reviewed: Allergy & Precautions, NPO status , Patient's Chart, lab work & pertinent test results  Airway Mallampati: II  TM Distance: >3 FB Neck ROM: Limited    Dental  (+) Teeth Intact   Pulmonary former smoker,    Pulmonary exam normal breath sounds clear to auscultation       Cardiovascular Exercise Tolerance: Good negative cardio ROS   Rhythm:Regular Rate:Normal     Neuro/Psych Anxiety    GI/Hepatic Neg liver ROS, GERD  Medicated and Controlled,Very rare.   Endo/Other  negative endocrine ROS  Renal/GU negative Renal ROS     Musculoskeletal  (+) Arthritis , Osteoarthritis,    Abdominal Normal abdominal exam  (+)  Abdomen: soft.    Peds  Hematology negative hematology ROS (+)   Anesthesia Other Findings   Reproductive/Obstetrics                             Anesthesia Physical  Anesthesia Plan  ASA: II  Anesthesia Plan: General   Post-op Pain Management:    Induction: Intravenous  PONV Risk Score and Plan:   Airway Management Planned: LMA  Additional Equipment:   Intra-op Plan:   Post-operative Plan: Extubation in OR  Informed Consent: I have reviewed the patients History and Physical, chart, labs and discussed the procedure including the risks, benefits and alternatives for the proposed anesthesia with the patient or authorized representative who has indicated his/her understanding and acceptance.     Plan Discussed with: CRNA  Anesthesia Plan Comments:         Anesthesia Quick Evaluation

## 2017-10-27 ENCOUNTER — Encounter: Payer: Self-pay | Admitting: Podiatry

## 2017-10-27 NOTE — Anesthesia Postprocedure Evaluation (Signed)
Anesthesia Post Note  Patient: Blake Lopez.  Procedure(s) Performed: HALLUX VALGUS CHEILECTOMY-GREAT TOE (Right Foot)  Patient location during evaluation: PACU Anesthesia Type: General Level of consciousness: awake and alert Pain management: pain level controlled Vital Signs Assessment: post-procedure vital signs reviewed and stable Respiratory status: spontaneous breathing, nonlabored ventilation, respiratory function stable and patient connected to nasal cannula oxygen Cardiovascular status: blood pressure returned to baseline and stable Postop Assessment: no apparent nausea or vomiting Anesthetic complications: no     Last Vitals:  Vitals:   10/26/17 1607 10/26/17 1637  BP: 138/80 (!) 147/90  Pulse: (!) 52 64  Resp: 13 14  Temp:  36.9 C  SpO2: 99% 100%    Last Pain:  Vitals:   10/27/17 0809  TempSrc:   PainSc: 9                  Martha Clan

## 2018-02-04 IMAGING — CT CT HEAD W/O CM
3 series · 16 of 47 positions shown, 19 images · non-contrast
Comparison: 09/05/2014

CLINICAL DATA: Non intractable episodic headache. Left-sided head
pain.

EXAM:
CT HEAD WITHOUT CONTRAST
TECHNIQUE: Contiguous axial images were obtained from the base of the skull
through the vertex without intravenous contrast.

[Series 2: head wo · axial · 0.44mm/px · z∈[-118,+17]mm · 10 of 33 slices shown, 13 images]
[im 3/33  brain]
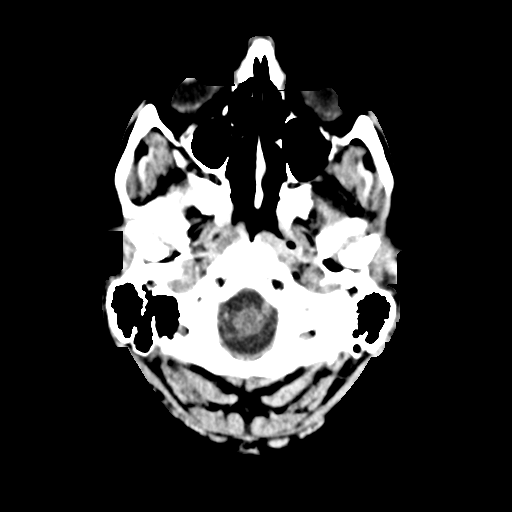
[im 3/33  bone]
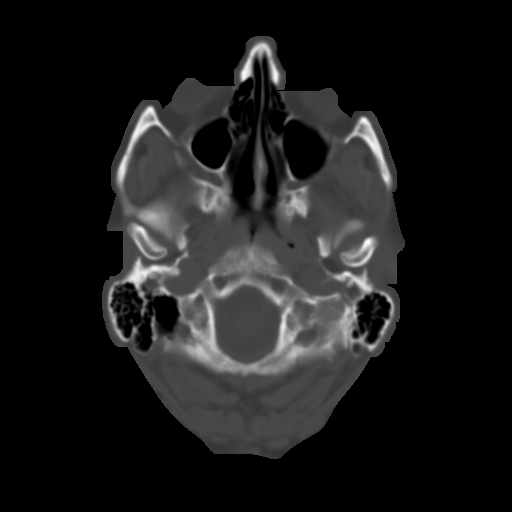
[im 6/33  brain]
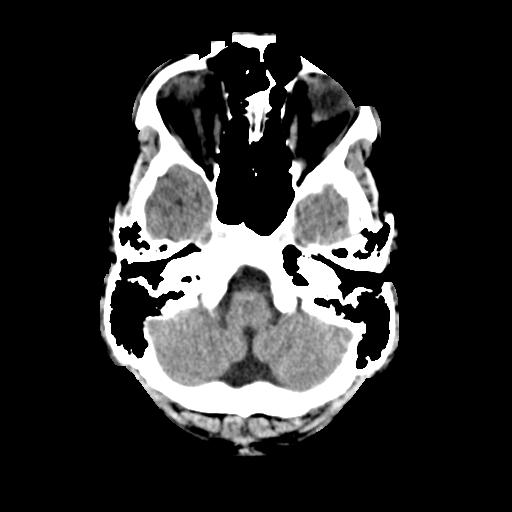
[im 9/33  brain]
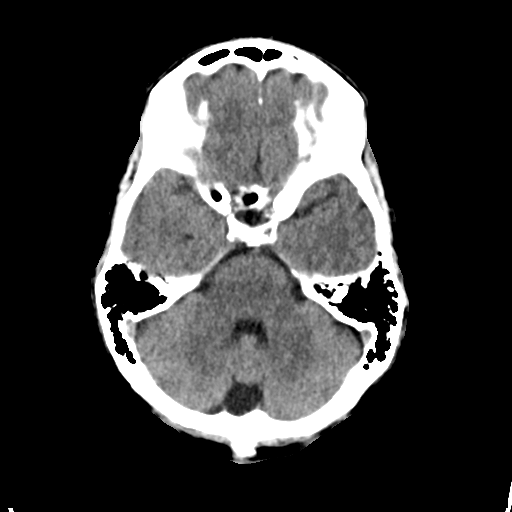
[im 12/33  brain]
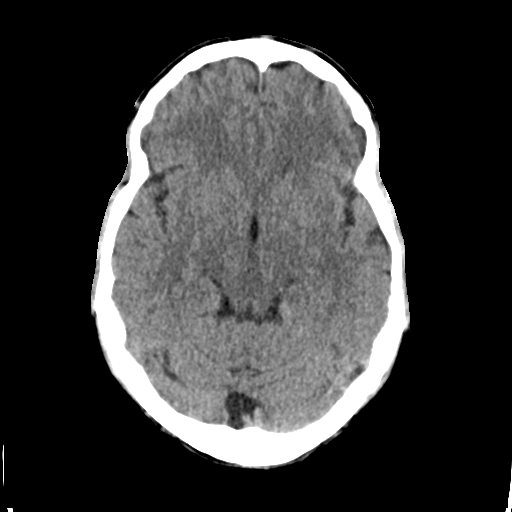
[im 15/33  brain]
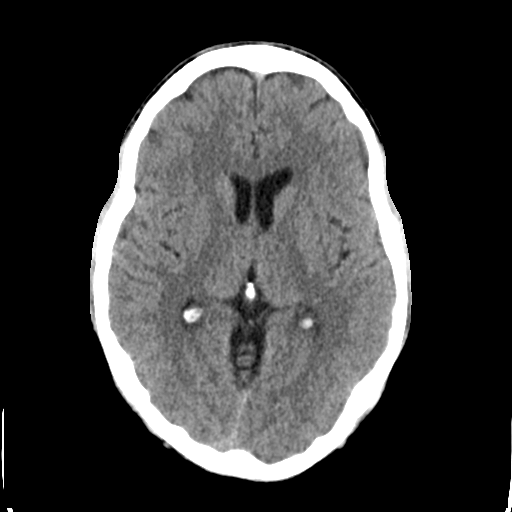
[im 15/33  bone]
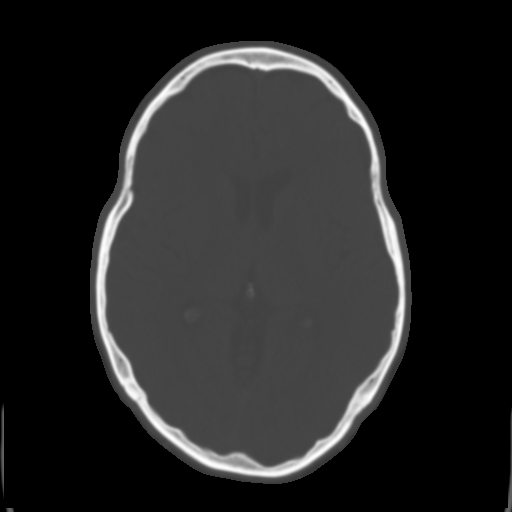
[im 18/33  brain]
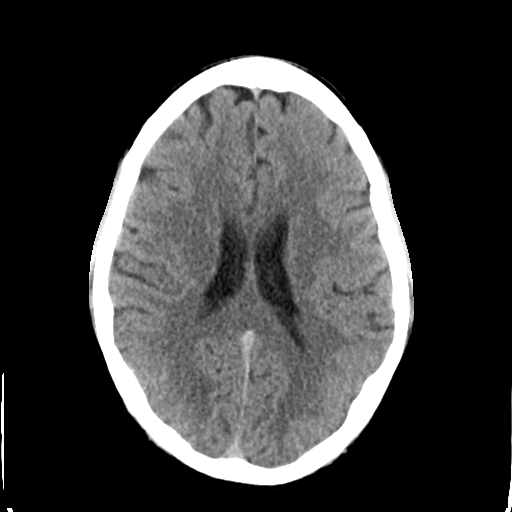
[im 21/33  brain]
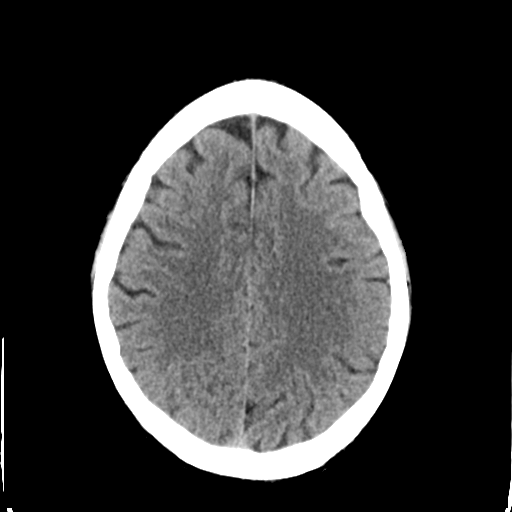
[im 25/33  brain]
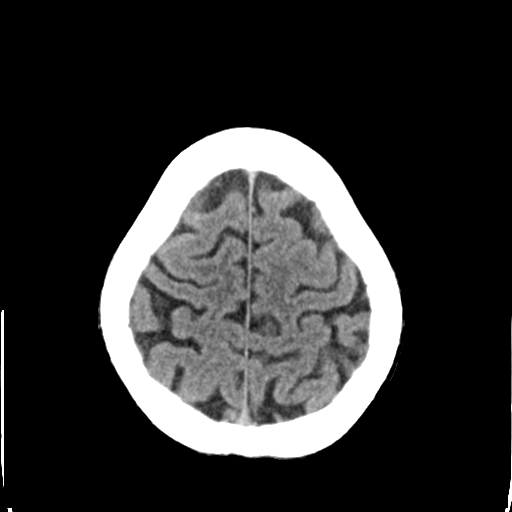
[im 27/33  brain]
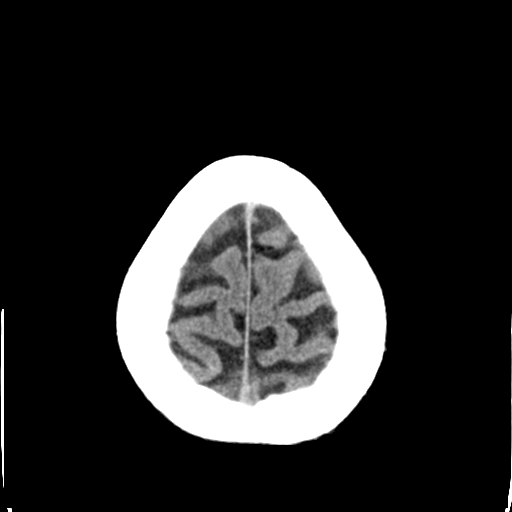
[im 27/33  bone]
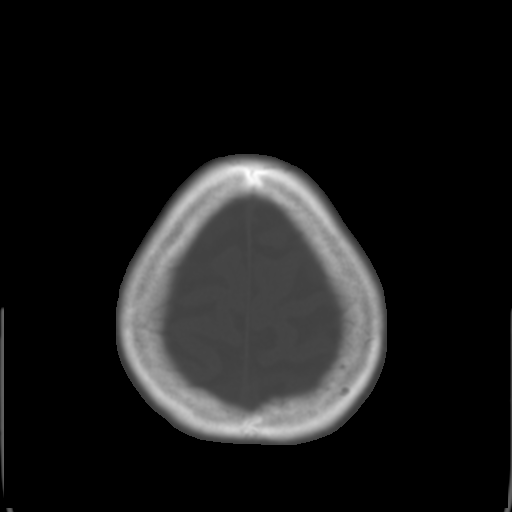
[im 30/33  brain]
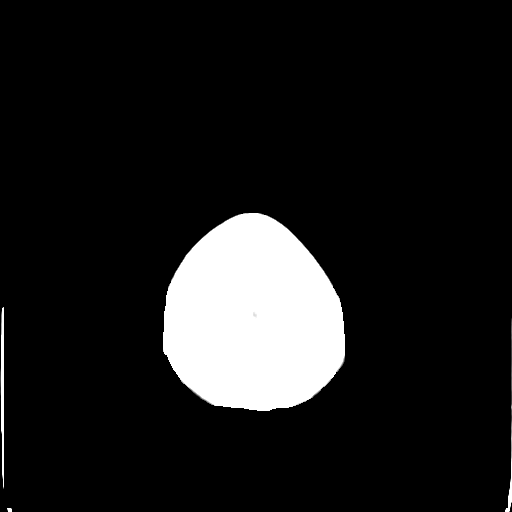

[Series 4: coronal soft tissue · coronal · 0.34mm/px · 3 of 67 slices shown]
[im 23/67  brain]
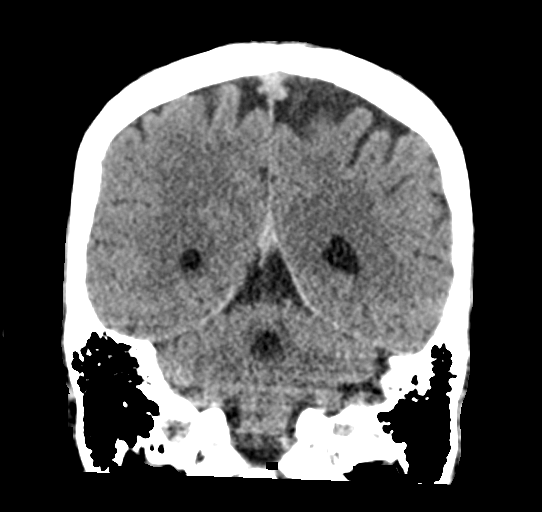
[im 30/67  brain]
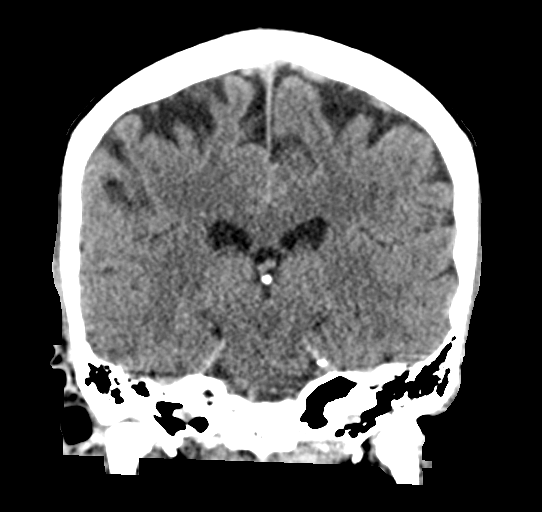
[im 37/67  brain]
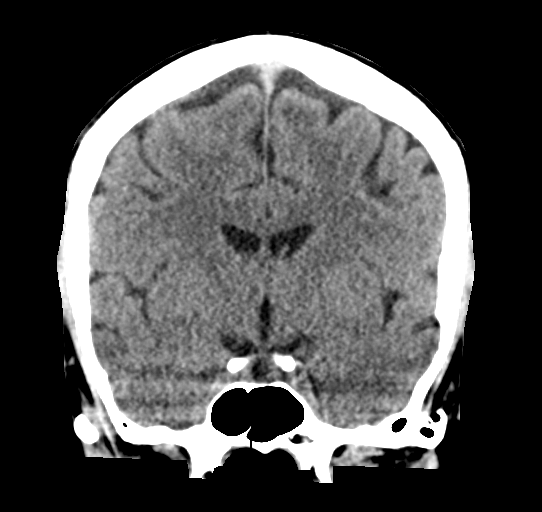

[Series 5: sagittal soft tissue · sagittal · 0.34mm/px · 3 of 51 slices shown]
[im 17/51  brain]
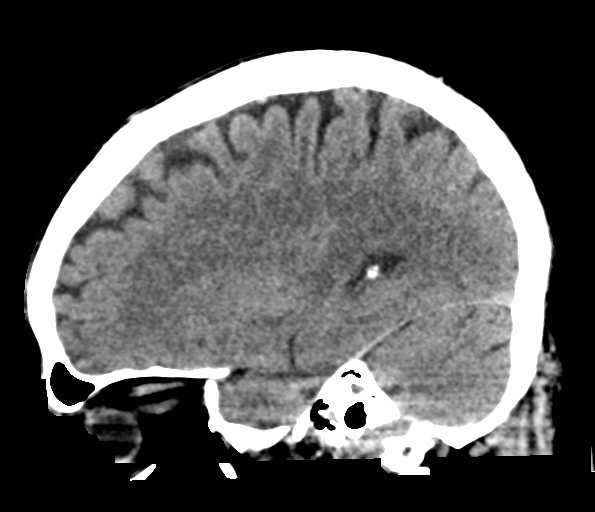
[im 26/51  brain]
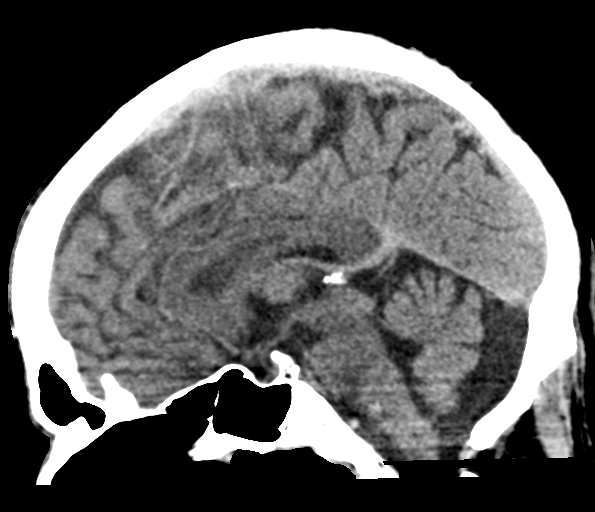
[im 34/51  brain]
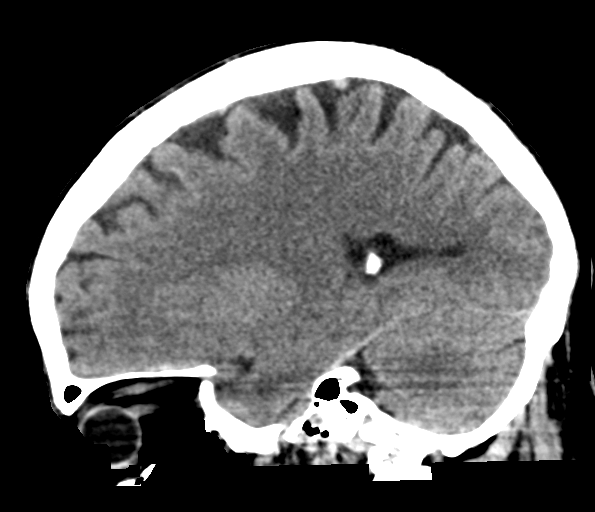

[16 of 47 positions shown; findings below may reference images not displayed]

FINDINGS: Brain: No evidence of infarction, hemorrhage, hydrocephalus,
extra-axial collection or mass lesion/mass effect.

Vascular: No hyperdense vessel or unexpected calcification.

Skull: Normal. Negative for fracture or focal lesion.

Sinuses/Orbits: Negative
IMPRESSION: Negative head CT.
